# Patient Record
Sex: Female | Born: 1990 | Race: White | Hispanic: No | Marital: Single | Smoking: Never smoker
Health system: Southern US, Community
[De-identification: ages and names within clinical notes are randomized; demographics above are authoritative.]

## PROBLEM LIST (undated history)

## (undated) DIAGNOSIS — F988 Other specified behavioral and emotional disorders with onset usually occurring in childhood and adolescence: Secondary | ICD-10-CM

## (undated) HISTORY — PX: ANKLE SURGERY: SHX546

---
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
## 2014-06-24 ENCOUNTER — Emergency Department (HOSPITAL_COMMUNITY): Payer: No Typology Code available for payment source

## 2014-06-24 ENCOUNTER — Encounter (HOSPITAL_COMMUNITY): Payer: Self-pay | Admitting: Emergency Medicine

## 2014-06-24 ENCOUNTER — Emergency Department (HOSPITAL_COMMUNITY)
Admission: EM | Admit: 2014-06-24 | Discharge: 2014-06-25 | Disposition: A | Discharge status: Home / Self Care | Payer: No Typology Code available for payment source | Attending: Emergency Medicine | Admitting: Emergency Medicine

## 2014-06-24 DIAGNOSIS — S99919A Unspecified injury of unspecified ankle, initial encounter: Secondary | ICD-10-CM | POA: Insufficient documentation

## 2014-06-24 DIAGNOSIS — S8010XA Contusion of unspecified lower leg, initial encounter: Secondary | ICD-10-CM | POA: Diagnosis not present

## 2014-06-24 DIAGNOSIS — Y92410 Unspecified street and highway as the place of occurrence of the external cause: Secondary | ICD-10-CM | POA: Insufficient documentation

## 2014-06-24 DIAGNOSIS — T148XXA Other injury of unspecified body region, initial encounter: Secondary | ICD-10-CM

## 2014-06-24 DIAGNOSIS — Z3202 Encounter for pregnancy test, result negative: Secondary | ICD-10-CM | POA: Insufficient documentation

## 2014-06-24 DIAGNOSIS — S301XXA Contusion of abdominal wall, initial encounter: Secondary | ICD-10-CM | POA: Diagnosis not present

## 2014-06-24 DIAGNOSIS — S82843A Displaced bimalleolar fracture of unspecified lower leg, initial encounter for closed fracture: Secondary | ICD-10-CM | POA: Diagnosis not present

## 2014-06-24 DIAGNOSIS — S82841A Displaced bimalleolar fracture of right lower leg, initial encounter for closed fracture: Secondary | ICD-10-CM

## 2014-06-24 DIAGNOSIS — E669 Obesity, unspecified: Secondary | ICD-10-CM | POA: Insufficient documentation

## 2014-06-24 DIAGNOSIS — S20219A Contusion of unspecified front wall of thorax, initial encounter: Secondary | ICD-10-CM | POA: Diagnosis not present

## 2014-06-24 DIAGNOSIS — S8990XA Unspecified injury of unspecified lower leg, initial encounter: Secondary | ICD-10-CM | POA: Diagnosis present

## 2014-06-24 DIAGNOSIS — S99929A Unspecified injury of unspecified foot, initial encounter: Secondary | ICD-10-CM

## 2014-06-24 DIAGNOSIS — IMO0002 Reserved for concepts with insufficient information to code with codable children: Secondary | ICD-10-CM | POA: Insufficient documentation

## 2014-06-24 DIAGNOSIS — Y9389 Activity, other specified: Secondary | ICD-10-CM | POA: Diagnosis not present

## 2014-06-24 LAB — CBC WITH DIFFERENTIAL/PLATELET
BASOS ABS: 0 10*3/uL (ref 0.0–0.1)
Basophils Relative: 0 % (ref 0–1)
EOS PCT: 1 % (ref 0–5)
Eosinophils Absolute: 0.1 10*3/uL (ref 0.0–0.7)
HCT: 36.9 % (ref 36.0–46.0)
Hemoglobin: 12.4 g/dL (ref 12.0–15.0)
LYMPHS PCT: 13 % (ref 12–46)
Lymphs Abs: 1.9 10*3/uL (ref 0.7–4.0)
MCH: 28.4 pg (ref 26.0–34.0)
MCHC: 33.6 g/dL (ref 30.0–36.0)
MCV: 84.6 fL (ref 78.0–100.0)
Monocytes Absolute: 0.8 10*3/uL (ref 0.1–1.0)
Monocytes Relative: 5 % (ref 3–12)
NEUTROS ABS: 12.6 10*3/uL — AB (ref 1.7–7.7)
NEUTROS PCT: 81 % — AB (ref 43–77)
Platelets: 306 10*3/uL (ref 150–400)
RBC: 4.36 MIL/uL (ref 3.87–5.11)
RDW: 13.7 % (ref 11.5–15.5)
WBC: 15.5 10*3/uL — AB (ref 4.0–10.5)

## 2014-06-24 LAB — I-STAT CHEM 8, ED
BUN: 12 mg/dL (ref 6–23)
CHLORIDE: 103 meq/L (ref 96–112)
Calcium, Ion: 1.14 mmol/L (ref 1.12–1.23)
Creatinine, Ser: 0.7 mg/dL (ref 0.50–1.10)
Glucose, Bld: 135 mg/dL — ABNORMAL HIGH (ref 70–99)
HCT: 39 % (ref 36.0–46.0)
Hemoglobin: 13.3 g/dL (ref 12.0–15.0)
Potassium: 3.9 mEq/L (ref 3.7–5.3)
Sodium: 138 mEq/L (ref 137–147)
TCO2: 24 mmol/L (ref 0–100)

## 2014-06-24 LAB — I-STAT CG4 LACTIC ACID, ED: Lactic Acid, Venous: 1.85 mmol/L (ref 0.5–2.2)

## 2014-06-24 LAB — POC URINE PREG, ED: Preg Test, Ur: NEGATIVE

## 2014-06-24 LAB — SAMPLE TO BLOOD BANK

## 2014-06-24 MED ORDER — ETOMIDATE 2 MG/ML IV SOLN
10.0000 mg | Freq: Once | INTRAVENOUS | Status: AC
  Administered 2014-06-24: 10 mg via INTRAVENOUS
  Filled 2014-06-24: qty 10

## 2014-06-24 MED ORDER — ONDANSETRON HCL 4 MG/2ML IJ SOLN
4.0000 mg | Freq: Once | INTRAMUSCULAR | Status: AC
  Administered 2014-06-24: 4 mg via INTRAVENOUS
  Filled 2014-06-24: qty 2

## 2014-06-24 MED ORDER — FENTANYL CITRATE 0.05 MG/ML IJ SOLN: 100.0000 ug | Freq: Once | INTRAMUSCULAR | Status: DC

## 2014-06-24 MED ORDER — FENTANYL CITRATE 0.05 MG/ML IJ SOLN
50.0000 ug | Freq: Once | INTRAMUSCULAR | Status: AC
  Administered 2014-06-24: 50 ug via INTRAVENOUS
  Filled 2014-06-24: qty 2

## 2014-06-24 MED ORDER — HYDROMORPHONE HCL PF 1 MG/ML IJ SOLN
1.0000 mg | Freq: Once | INTRAMUSCULAR | Status: AC
  Administered 2014-06-24: 1 mg via INTRAVENOUS
  Filled 2014-06-24: qty 1

## 2014-06-24 MED ORDER — IOHEXOL 300 MG/ML  SOLN
100.0000 mL | Freq: Once | INTRAMUSCULAR | Status: AC | PRN
  Administered 2014-06-24: 100 mL via INTRAVENOUS

## 2014-06-24 MED ORDER — SODIUM CHLORIDE 0.9 % IV SOLN
Freq: Once | INTRAVENOUS | Status: AC
  Administered 2014-06-24: 18:00:00 via INTRAVENOUS

## 2014-06-24 MED ORDER — SODIUM CHLORIDE 0.9 % IV BOLUS (SEPSIS)
1000.0000 mL | Freq: Once | INTRAVENOUS | Status: AC
  Administered 2014-06-24: 1000 mL via INTRAVENOUS

## 2014-06-24 NOTE — ED Notes (Signed)
Pt to xray at this time.

## 2014-06-24 NOTE — ED Notes (Signed)
+   pedal pulse present in right foot.  Ice pack applied for comfort.  Pt logged rolled and spine board removed.  Pt denies any pain other than right ankle

## 2014-06-24 NOTE — ED Notes (Signed)
Family at bedside. 

## 2014-06-24 NOTE — ED Notes (Signed)
Dr. Rubin Payor given Lactic results: 1.85
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
## 2014-06-24 NOTE — ED Notes (Signed)
Pt resting at this time. Family at bedside.  Pt alert and oriented x's 3.  Skin warm and dry, color appropriate
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
## 2014-06-24 NOTE — ED Notes (Signed)
PT to CT at this time.

## 2014-06-24 NOTE — Progress Notes (Signed)
Orthopedic Tech Progress Note Patient Details:  Carle Dargan 03/29/1991 [id number]  Ortho Devices Type of Ortho Device: Ace wrap;Stirrup splint;Post (short leg) splint Ortho Device/Splint Location: rle Ortho Device/Splint Interventions: Application Ankle reduction; ordered by Dr. Jarvis Newcomer, Deeksha Cotrell 06/24/2014, 9:10 PM

## 2014-06-24 NOTE — ED Notes (Signed)
Family at bedside, pt remains alert and oriented, waiting for CT at this time.  Splint in place on right lower leg,

## 2014-06-25 DIAGNOSIS — S82843A Displaced bimalleolar fracture of unspecified lower leg, initial encounter for closed fracture: Secondary | ICD-10-CM | POA: Diagnosis not present

## 2014-06-25 MED ORDER — ONDANSETRON 4 MG PO TBDP
8.0000 mg | ORAL_TABLET | Freq: Once | ORAL | Status: AC
  Administered 2014-06-25: 8 mg via ORAL
  Filled 2014-06-25: qty 2

## 2014-06-25 MED ORDER — ONDANSETRON HCL 4 MG PO TABS: 4.0000 mg | ORAL_TABLET | Freq: Four times a day (QID) | ORAL | Status: DC

## 2014-06-25 MED ORDER — OXYCODONE-ACETAMINOPHEN 5-325 MG PO TABS
1.0000 | ORAL_TABLET | ORAL | Status: DC | PRN
Start: 2014-06-25 — End: 2014-06-25

## 2014-06-25 MED ORDER — OXYCODONE-ACETAMINOPHEN 5-325 MG PO TABS
1.0000 | ORAL_TABLET | Freq: Four times a day (QID) | ORAL | Status: DC | PRN
Start: 2014-06-25 — End: 2016-12-29

## 2014-06-25 MED ORDER — OXYCODONE-ACETAMINOPHEN 5-325 MG PO TABS
2.0000 | ORAL_TABLET | Freq: Once | ORAL | Status: AC
  Administered 2014-06-25: 2 via ORAL
  Filled 2014-06-25: qty 2
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
## 2016-05-18 DIAGNOSIS — F900 Attention-deficit hyperactivity disorder, predominantly inattentive type: Secondary | ICD-10-CM | POA: Diagnosis not present

## 2016-07-27 DIAGNOSIS — S93431D Sprain of tibiofibular ligament of right ankle, subsequent encounter: Secondary | ICD-10-CM | POA: Diagnosis not present

## 2016-08-07 DIAGNOSIS — M25571 Pain in right ankle and joints of right foot: Secondary | ICD-10-CM | POA: Diagnosis not present

## 2016-08-11 DIAGNOSIS — M25571 Pain in right ankle and joints of right foot: Secondary | ICD-10-CM | POA: Diagnosis not present

## 2016-08-18 DIAGNOSIS — M25571 Pain in right ankle and joints of right foot: Secondary | ICD-10-CM | POA: Diagnosis not present

## 2016-08-24 DIAGNOSIS — F900 Attention-deficit hyperactivity disorder, predominantly inattentive type: Secondary | ICD-10-CM | POA: Diagnosis not present

## 2016-12-29 ENCOUNTER — Encounter (HOSPITAL_COMMUNITY): Payer: Self-pay

## 2016-12-29 ENCOUNTER — Emergency Department (HOSPITAL_COMMUNITY): Payer: BLUE CROSS/BLUE SHIELD

## 2016-12-29 ENCOUNTER — Emergency Department (HOSPITAL_COMMUNITY)
Admission: EM | Admit: 2016-12-29 | Discharge: 2016-12-29 | Disposition: A | Discharge status: Home / Self Care | Payer: BLUE CROSS/BLUE SHIELD | Attending: Emergency Medicine | Admitting: Emergency Medicine

## 2016-12-29 DIAGNOSIS — R1012 Left upper quadrant pain: Secondary | ICD-10-CM

## 2016-12-29 DIAGNOSIS — R112 Nausea with vomiting, unspecified: Secondary | ICD-10-CM | POA: Insufficient documentation

## 2016-12-29 DIAGNOSIS — R1110 Vomiting, unspecified: Secondary | ICD-10-CM | POA: Diagnosis not present

## 2016-12-29 DIAGNOSIS — R509 Fever, unspecified: Secondary | ICD-10-CM | POA: Diagnosis not present

## 2016-12-29 DIAGNOSIS — F909 Attention-deficit hyperactivity disorder, unspecified type: Secondary | ICD-10-CM | POA: Insufficient documentation

## 2016-12-29 DIAGNOSIS — K529 Noninfective gastroenteritis and colitis, unspecified: Secondary | ICD-10-CM | POA: Diagnosis not present

## 2016-12-29 DIAGNOSIS — R109 Unspecified abdominal pain: Secondary | ICD-10-CM | POA: Diagnosis not present

## 2016-12-29 HISTORY — DX: Other specified behavioral and emotional disorders with onset usually occurring in childhood and adolescence: F98.8

## 2016-12-29 LAB — CBC
HCT: 40.8 % (ref 36.0–46.0)
Hemoglobin: 13.4 g/dL (ref 12.0–15.0)
MCH: 28.5 pg (ref 26.0–34.0)
MCHC: 32.8 g/dL (ref 30.0–36.0)
MCV: 86.8 fL (ref 78.0–100.0)
PLATELETS: 268 10*3/uL (ref 150–400)
RBC: 4.7 MIL/uL (ref 3.87–5.11)
RDW: 13.7 % (ref 11.5–15.5)
WBC: 7.4 10*3/uL (ref 4.0–10.5)

## 2016-12-29 LAB — URINALYSIS, ROUTINE W REFLEX MICROSCOPIC
Bacteria, UA: NONE SEEN
Bilirubin Urine: NEGATIVE
GLUCOSE, UA: NEGATIVE mg/dL
Hgb urine dipstick: NEGATIVE
Ketones, ur: NEGATIVE mg/dL
Leukocytes, UA: NEGATIVE
NITRITE: NEGATIVE
PH: 5 (ref 5.0–8.0)
Protein, ur: 30 mg/dL — AB
Specific Gravity, Urine: 1.032 — ABNORMAL HIGH (ref 1.005–1.030)

## 2016-12-29 LAB — COMPREHENSIVE METABOLIC PANEL
ALT: 23 U/L (ref 14–54)
ANION GAP: 11 (ref 5–15)
AST: 25 U/L (ref 15–41)
Albumin: 4.1 g/dL (ref 3.5–5.0)
Alkaline Phosphatase: 54 U/L (ref 38–126)
BILIRUBIN TOTAL: 0.8 mg/dL (ref 0.3–1.2)
BUN: 13 mg/dL (ref 6–20)
CO2: 22 mmol/L (ref 22–32)
Calcium: 9.2 mg/dL (ref 8.9–10.3)
Chloride: 105 mmol/L (ref 101–111)
Creatinine, Ser: 0.82 mg/dL (ref 0.44–1.00)
GFR calc Af Amer: >60 mL/min (ref >60)
GFR calc non Af Amer: >60 mL/min (ref >60)
Glucose, Bld: 126 mg/dL — ABNORMAL HIGH (ref 65–99)
Potassium: 3.6 mmol/L (ref 3.5–5.1)
Sodium: 138 mmol/L (ref 135–145)
TOTAL PROTEIN: 7.9 g/dL (ref 6.5–8.1)

## 2016-12-29 LAB — I-STAT BETA HCG BLOOD, ED (MC, WL, AP ONLY): I-stat hCG, quantitative: <5 m[IU]/mL (ref <5)

## 2016-12-29 LAB — LIPASE, BLOOD: Lipase: 12 U/L (ref 11–51)

## 2016-12-29 MED ORDER — ACETAMINOPHEN 325 MG PO TABS
650.0000 mg | ORAL_TABLET | Freq: Once | ORAL | Status: AC
  Administered 2016-12-29: 650 mg via ORAL
  Filled 2016-12-29: qty 2

## 2016-12-29 MED ORDER — IOPAMIDOL (ISOVUE-300) INJECTION 61%
INTRAVENOUS | Status: AC
  Administered 2016-12-29: 100 mL
  Filled 2016-12-29: qty 100

## 2016-12-29 MED ORDER — KETOROLAC TROMETHAMINE 30 MG/ML IJ SOLN: 30.0000 mg | Freq: Once | INTRAMUSCULAR | Status: DC

## 2016-12-29 MED ORDER — ONDANSETRON 4 MG PO TBDP: 4.0000 mg | ORAL_TABLET | Freq: Three times a day (TID) | ORAL | 0 refills | Status: DC | PRN

## 2016-12-29 MED ORDER — SODIUM CHLORIDE 0.9 % IV BOLUS (SEPSIS)
1000.0000 mL | Freq: Once | INTRAVENOUS | Status: AC
  Administered 2016-12-29: 1000 mL via INTRAVENOUS

## 2016-12-29 NOTE — Discharge Instructions (Signed)
Your CT scan was unremarkable. You likely have a viral infection. I gave you a prescription for medicine to take for your nausea. Drink plenty of fluids to stay hydrated. Take Tylenol or ibuprofen as needed for pain/fever. Follow up with your primary care provider this week. Return to the ER for new or worsening symptoms.

## 2016-12-29 NOTE — ED Notes (Signed)
Patient states that she had a fever at home of 100.3.

## 2016-12-29 NOTE — ED Triage Notes (Signed)
Pt sent here [facility] for onset last night 9pm abdominal pain, fever, N&V x 10.  Pt was given Phenergan IM at u/c.

## 2016-12-29 NOTE — ED Triage Notes (Signed)
Pt was also given Tylenol at u/c around 9am.

## 2016-12-29 NOTE — ED Notes (Signed)
Rounded on patient aware she is waiting on a stretcher no further complaints.

## 2016-12-29 NOTE — ED Provider Notes (Signed)
[facility]-EMERGENCY DEPT Provider Note   CSN: [id number] Arrival date & time: 12/29/16  1125  History   Chief Complaint Chief Complaint  Patient presents with  . Abdominal Pain   HPI  Sydney Coffey is an 26 y.o. female with history of ADHD who presents to the ED for evaluation of abdominal pain. She states since 9PM last night she had "numerous" episodes of NBNB emesis and nausea. She describes diffuse crampy abdominal pain which has since improved. She still has intermittent twinges of LUQ pain. Currently she rates her pain 4-5/10. Her nausea has resolved since receiving IM phenergan at urgent care earlier today. She does note associated oral temp 100.58F at home this morning; she was given a dose of Tylenol at UC around 9 AM and fever has since resolved. She states overall she feels much better. Denies any diarrhea. Denies cough, congestion, body aches. Denies chest pain or SOB. She was sent to the ED from urgent care for further evaluation.   Past Medical History:  Diagnosis Date  . ADD (attention deficit disorder)     There are no active problems to display for this patient.   Past Surgical History:  Procedure Laterality Date  . ANKLE SURGERY Right     OB History    No data available       Home Medications    Prior to Admission medications   Medication Sig Start Date End Date Taking? Authorizing Provider  CALCIUM PO Take 1 tablet by mouth daily.    Historical Provider, MD  CINNAMON PO Take 1 tablet by mouth daily.    Historical Provider, MD  Multiple Vitamins-Minerals (HAIR/SKIN/NAILS) TABS Take 1 tablet by mouth daily.    Historical Provider, MD  ondansetron (ZOFRAN) 4 MG tablet Take 1 tablet (4 mg total) by mouth every 6 (six) hours. 06/25/14   Junious Silk, PA-C  oxyCODONE-acetaminophen (PERCOCET/ROXICET) 5-325 MG per tablet Take 1 tablet by mouth every 6 (six) hours as needed for severe pain. May take 2 tablets PO q 6 hours for severe pain - Do not take with Tylenol as this  tablet already contains tylenol 06/25/14   Junious Silk, PA-C    Family History History reviewed. No pertinent family history.  Social History Social History  Substance Use Topics  . Smoking status: Never Smoker  . Smokeless tobacco: Never Used  . Alcohol use Yes     Comment: rarely     Allergies   Patient has no known allergies.   Review of Systems Review of Systems 10 Systems reviewed and are negative for acute change except as noted in the HPI.   Physical Exam Updated Vital Signs BP 118/74   Pulse (!) 104   Temp 98.6 F (37 C) (Oral)   Resp 16   Ht 5\' 4"  (1.626 m)   Wt 124.7 kg Comment: pt was weighed at urgent care this morning  LMP 12/07/2016   SpO2 97%   BMI 47.20 kg/m   Physical Exam  Constitutional: She is oriented to person, place, and time.  HENT:  Right Ear: External ear normal.  Left Ear: External ear normal.  Nose: Nose normal.  Mouth/Throat: Oropharynx is clear and moist. No oropharyngeal exudate.  Eyes: Conjunctivae are normal.  Neck: Neck supple.  Cardiovascular: Normal rate, regular rhythm, normal heart sounds and intact distal pulses.   Pulmonary/Chest: Effort normal and breath sounds normal. No respiratory distress. She has no wheezes.  Abdominal: Soft. Bowel sounds are normal. She exhibits no distension. There is  tenderness (minimal diffuse tenderness but most in LUQ). There is no rebound and no guarding.  Musculoskeletal: She exhibits no edema.  Lymphadenopathy:    She has no cervical adenopathy.  Neurological: She is alert and oriented to person, place, and time. No cranial nerve deficit.  Skin: Skin is warm and dry.  Psychiatric: She has a normal mood and affect.  Nursing note and vitals reviewed.    ED Treatments / Results  Labs (all labs ordered are listed, but only abnormal results are displayed) Labs Reviewed  COMPREHENSIVE METABOLIC PANEL - Abnormal; Notable for the following:       Result Value   Glucose, Bld 126 (*)     All other components within normal limits  URINALYSIS, ROUTINE W REFLEX MICROSCOPIC - Abnormal; Notable for the following:    APPearance HAZY (*)    Specific Gravity, Urine 1.032 (*)    Protein, ur 30 (*)    Squamous Epithelial / LPF 0-5 (*)    All other components within normal limits  LIPASE, BLOOD  CBC  I-STAT BETA HCG BLOOD, ED ([facility], [facility], [facility] ONLY)    EKG  EKG Interpretation None       Radiology Ct Abdomen Pelvis W Contrast  Result Date: 12/29/2016 CLINICAL DATA:  Lower abdominal pain, fever and vomiting. EXAM: CT ABDOMEN AND PELVIS WITH CONTRAST TECHNIQUE: Multidetector CT imaging of the abdomen and pelvis was performed using the standard protocol following bolus administration of intravenous contrast. CONTRAST:  100 ISOVUE-300 IOPAMIDOL (ISOVUE-300) INJECTION 61% COMPARISON:  06/24/2014 FINDINGS: Lower chest: No acute abnormality. Hepatobiliary: No focal liver abnormality is seen. No gallstones, gallbladder wall thickening, or biliary dilatation. Pancreas: Unremarkable. No pancreatic ductal dilatation or surrounding inflammatory changes. Spleen: Normal in size without focal abnormality. Accessory splenule noted. Adrenals/Urinary Tract: Adrenal glands are unremarkable. Kidneys are normal, without renal calculi, focal lesion, or hydronephrosis. Bladder is unremarkable. Stomach/Bowel: Stomach is within normal limits. Appendix appears normal. No evidence of bowel wall thickening, distention, or inflammatory changes. Vascular/Lymphatic: No significant vascular findings are present. No enlarged abdominal or pelvic lymph nodes. Reproductive: Uterus and bilateral adnexa are unremarkable. Other: No abdominal wall hernia or abnormality. No abdominopelvic ascites. Musculoskeletal: No acute osseous finding. L5-S1 degenerative disc disease noted. IMPRESSION: No acute intra-abdominal or pelvic finding.  Stable exam. Electronically Signed   By: Judie Petit.  Shick M.D.   On: 12/29/2016 19:15     Procedures Procedures (including critical care time)  Medications Ordered in ED Medications  sodium chloride 0.9 % bolus 1,000 mL (0 mLs Intravenous Stopped 12/29/16 1941)  acetaminophen (TYLENOL) tablet 650 mg (650 mg Oral Given 12/29/16 1940)  iopamidol (ISOVUE-300) 61 % injection (100 mLs  Contrast Given 12/29/16 1855)     Initial Impression / Assessment and Plan / ED Course  I have reviewed the triage vital signs and the nursing notes.  Pertinent labs & imaging results that were available during my care of the patient were reviewed by me and considered in my medical decision making (see chart for details).    26 y.o. female sent from UC for further eval of n/v and abdominal pain. Low grade fever at home. Afebrile here. Labs unrevealing. Abdominal exam with mild diffuse tenderness but no localized tenderness, no evidence of peritonitis. She does appear a bit dry and urine with increased specific gravity. This is likely contributing to her tachycardia. We'll hydrate with a liter of NS. She declines anti-emetics, states nausea has resolved. We did obtain a CT abd/pelvis given urgent care's concern  for acute appy and pt's temp starting to trend up again as tylenol wearing off. CT negative for acute findings. Pt is hungry and is tolerating PO. Suspect viral gastroenteritis. Will dc home with rx for zofran, encouraged supportive therapies. Strict return precautions given.  Final Clinical Impressions(s) / ED Diagnoses   Final diagnoses:  Left upper quadrant pain  Non-intractable vomiting with nausea, unspecified vomiting type    New Prescriptions Discharge Medication List as of 12/29/2016  7:21 PM    START taking these medications   Details  ondansetron (ZOFRAN ODT) 4 MG disintegrating tablet Take 1 tablet (4 mg total) by mouth every 8 (eight) hours as needed for nausea or vomiting., Starting Tue 12/29/2016, Print         Carlene CoriaSerena Y Rajon Bisig, PA-C 12/29/16 1951    Canary Brimhristopher J  Tegeler, MD 12/30/16 1140

## 2017-02-22 DIAGNOSIS — F900 Attention-deficit hyperactivity disorder, predominantly inattentive type: Secondary | ICD-10-CM | POA: Diagnosis not present

## 2017-02-22 DIAGNOSIS — R7303 Prediabetes: Secondary | ICD-10-CM | POA: Diagnosis not present

## 2017-08-25 DIAGNOSIS — R7303 Prediabetes: Secondary | ICD-10-CM | POA: Diagnosis not present

## 2017-08-25 DIAGNOSIS — F900 Attention-deficit hyperactivity disorder, predominantly inattentive type: Secondary | ICD-10-CM | POA: Diagnosis not present

## 2018-02-22 DIAGNOSIS — F900 Attention-deficit hyperactivity disorder, predominantly inattentive type: Secondary | ICD-10-CM | POA: Diagnosis not present

## 2018-04-07 DIAGNOSIS — M545 Low back pain: Secondary | ICD-10-CM | POA: Diagnosis not present

## 2018-04-07 DIAGNOSIS — S339XXA Sprain of unspecified parts of lumbar spine and pelvis, initial encounter: Secondary | ICD-10-CM | POA: Diagnosis not present

## 2018-08-26 DIAGNOSIS — R7303 Prediabetes: Secondary | ICD-10-CM | POA: Diagnosis not present

## 2018-08-26 DIAGNOSIS — F900 Attention-deficit hyperactivity disorder, predominantly inattentive type: Secondary | ICD-10-CM | POA: Diagnosis not present

## 2018-11-17 IMAGING — CT CT ABD-PELV W/ CM
2 of 4 series · 17 of 46 positions shown, 19 images · IV contrast (APPLIED)
Comparison: 06/24/2014

CLINICAL DATA: Lower abdominal pain, fever and vomiting.

EXAM:
CT ABDOMEN AND PELVIS WITH CONTRAST
TECHNIQUE: Multidetector CT imaging of the abdomen and pelvis was performed
using the standard protocol following bolus administration of
intravenous contrast.
CONTRAST:  100 7EW9Z9-4YY IOPAMIDOL (7EW9Z9-4YY) INJECTION 61%

[Series 3: abd/ pelvis 5.0 i30f 2 · axial · 0.98mm/px · z∈[+675,+1130]mm · 14 of 101 slices shown, 16 images]
[im 5/101  soft-tissue]
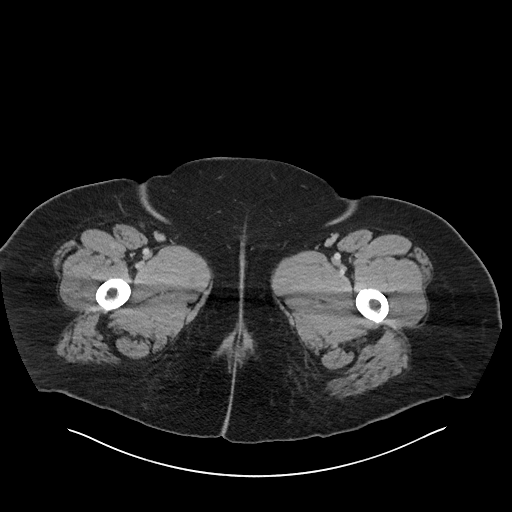
[im 5/101  bone]
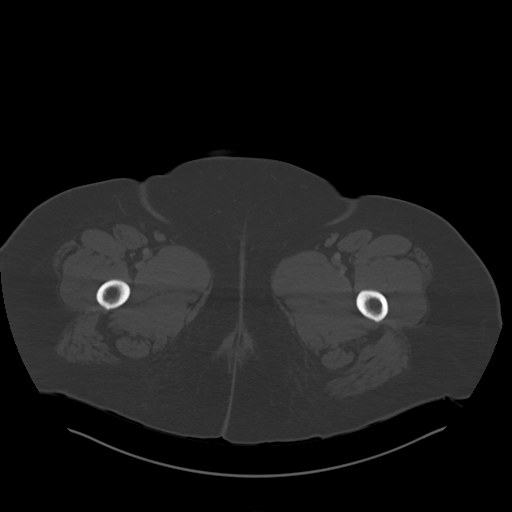
[im 13/101  soft-tissue]
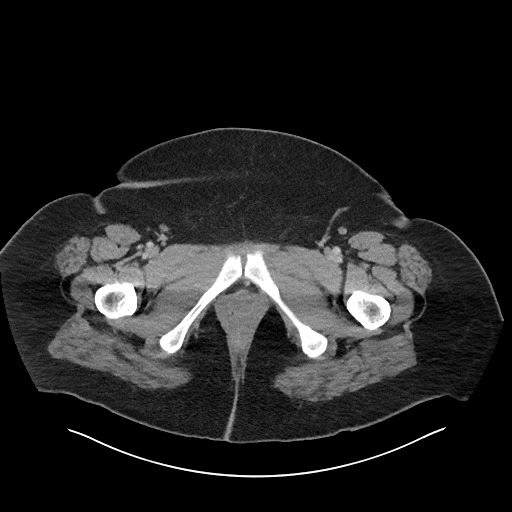
[im 21/101  soft-tissue]
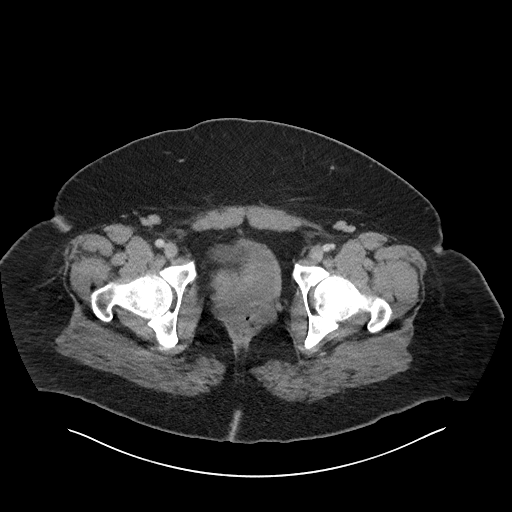
[im 26/101  soft-tissue]
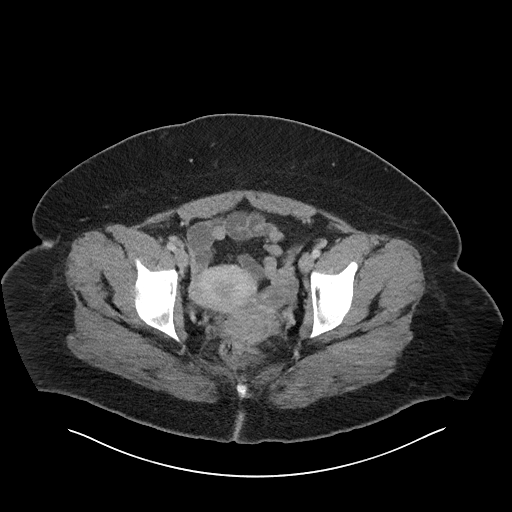
[im 34/101  soft-tissue]
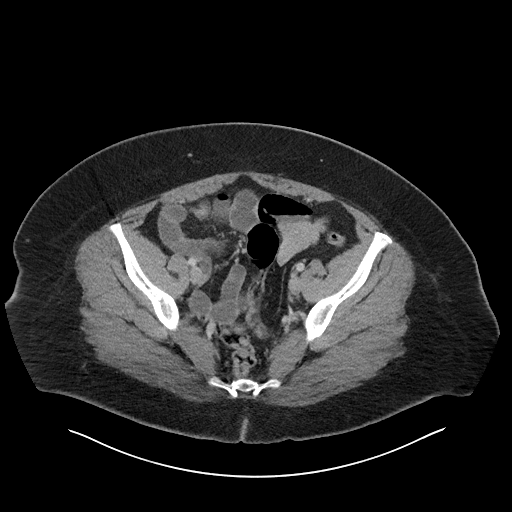
[im 42/101  soft-tissue]
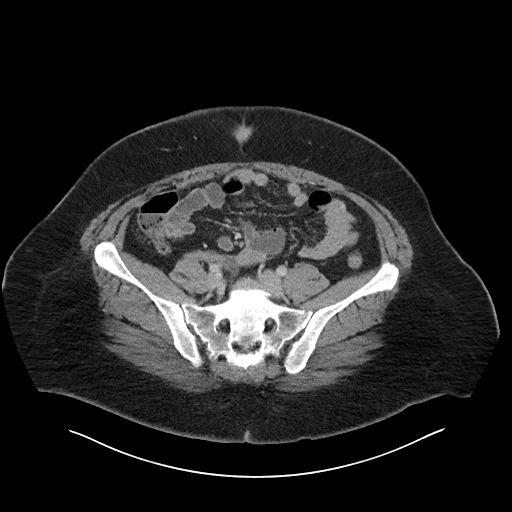
[im 46/101  soft-tissue]
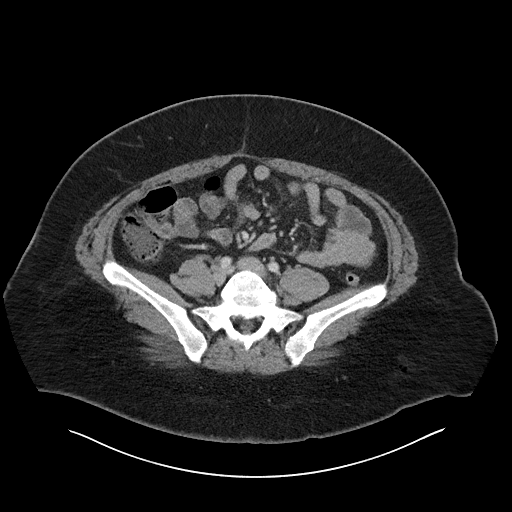
[im 55/101  soft-tissue]
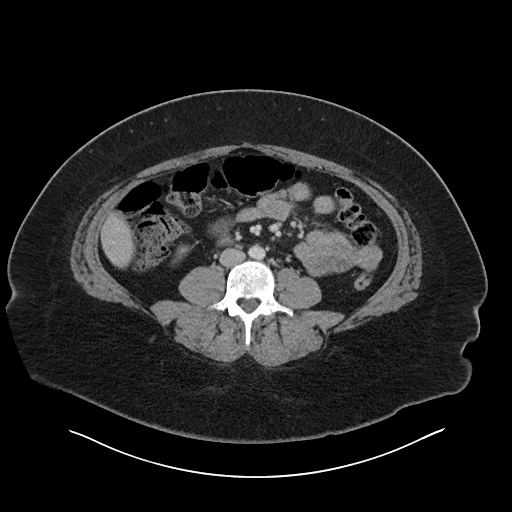
[im 59/101  soft-tissue]
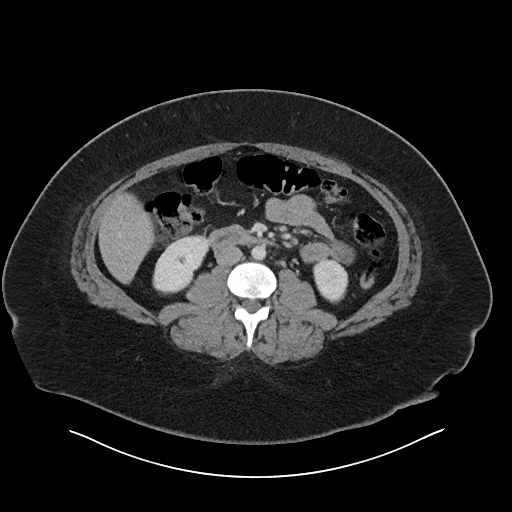
[im 59/101  bone]
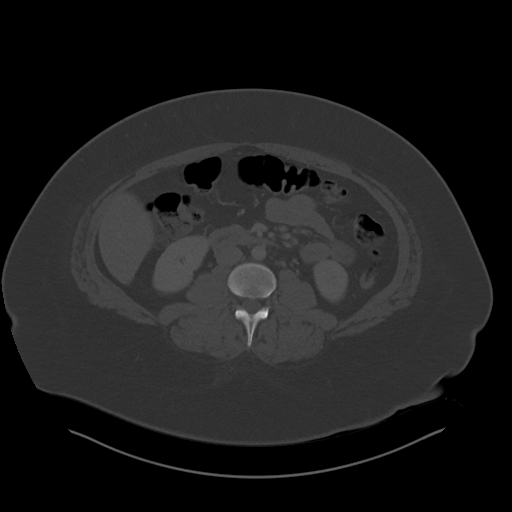
[im 67/101  soft-tissue]
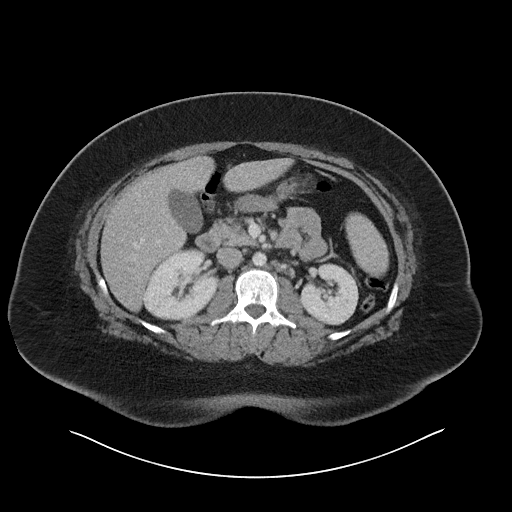
[im 76/101  soft-tissue]
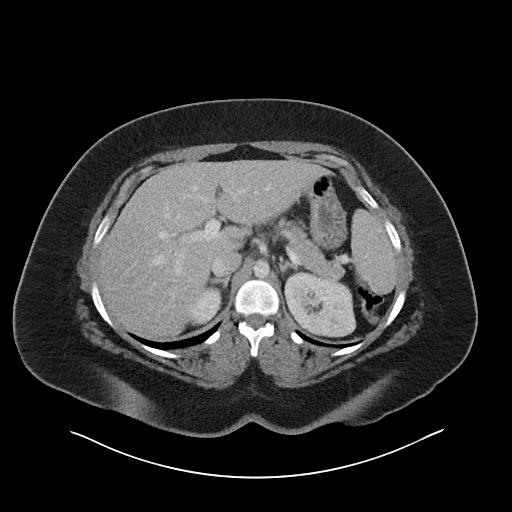
[im 80/101  soft-tissue]
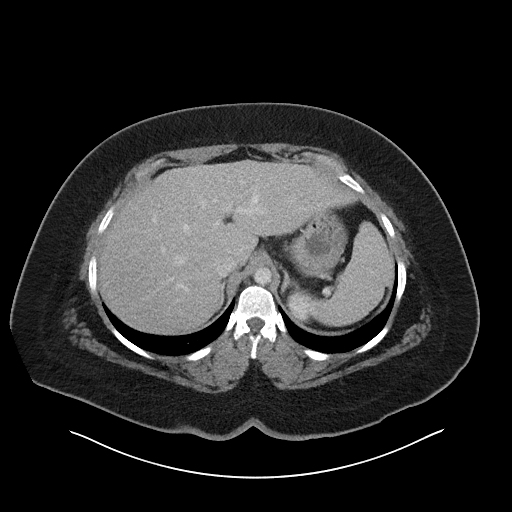
[im 88/101  soft-tissue]
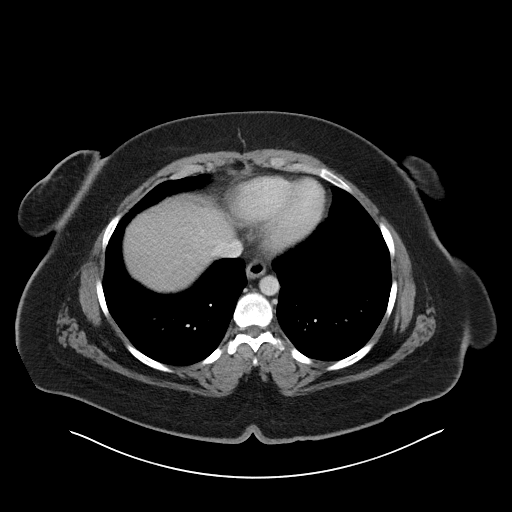
[im 96/101  soft-tissue]
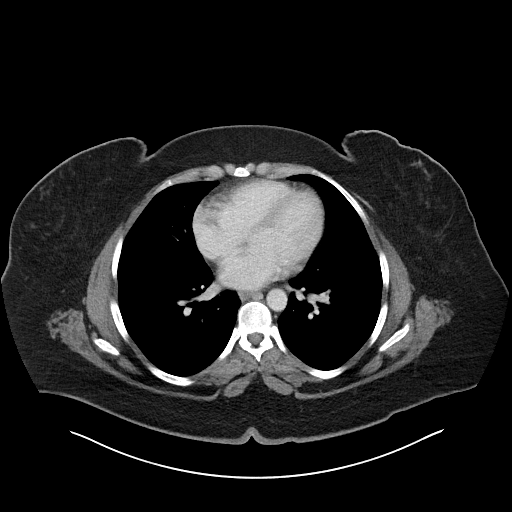

[Series 5: coronal soft tissue · coronal · 0.96mm/px · 3 of 120 slices shown]
[im 40/120  soft-tissue]
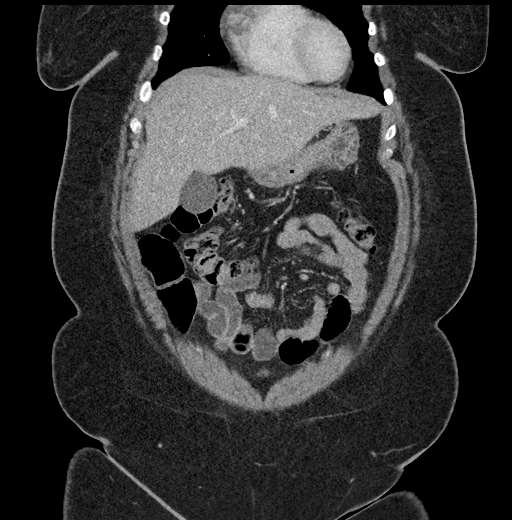
[im 53/120  soft-tissue]
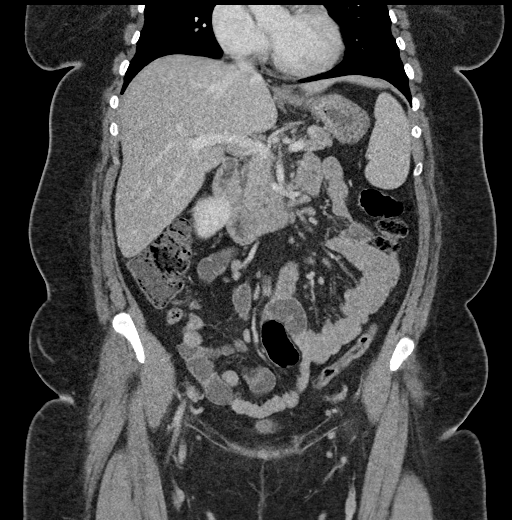
[im 67/120  soft-tissue]
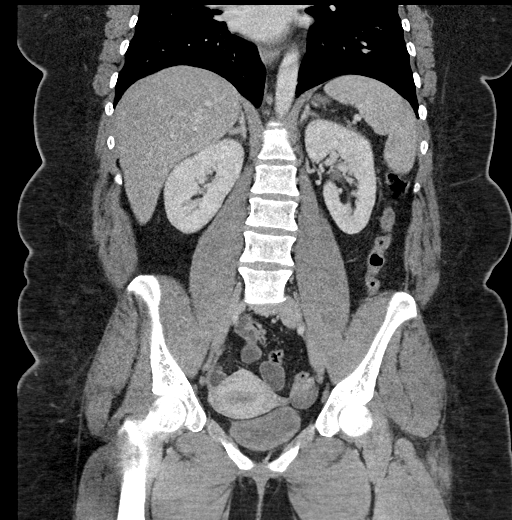

[17 of 46 positions shown; findings below may reference images not displayed]

FINDINGS: Lower chest: No acute abnormality.

Hepatobiliary: No focal liver abnormality is seen. No gallstones,
gallbladder wall thickening, or biliary dilatation.

Pancreas: Unremarkable. No pancreatic ductal dilatation or
surrounding inflammatory changes.

Spleen: Normal in size without focal abnormality. Accessory splenule
noted.

Adrenals/Urinary Tract: Adrenal glands are unremarkable. Kidneys are
normal, without renal calculi, focal lesion, or hydronephrosis.
Bladder is unremarkable.

Stomach/Bowel: Stomach is within normal limits. Appendix appears
normal. No evidence of bowel wall thickening, distention, or
inflammatory changes.

Vascular/Lymphatic: No significant vascular findings are present. No
enlarged abdominal or pelvic lymph nodes.

Reproductive: Uterus and bilateral adnexa are unremarkable.

Other: No abdominal wall hernia or abnormality. No abdominopelvic
ascites.

Musculoskeletal: No acute osseous finding. L5-S1 degenerative disc
disease noted.
IMPRESSION: No acute intra-abdominal or pelvic finding.  Stable exam.

## 2019-02-24 DIAGNOSIS — F900 Attention-deficit hyperactivity disorder, predominantly inattentive type: Secondary | ICD-10-CM | POA: Diagnosis not present

## 2019-09-15 DIAGNOSIS — F900 Attention-deficit hyperactivity disorder, predominantly inattentive type: Secondary | ICD-10-CM | POA: Diagnosis not present

## 2020-04-28 DIAGNOSIS — Z30011 Encounter for initial prescription of contraceptive pills: Secondary | ICD-10-CM | POA: Diagnosis not present

## 2023-03-18 ENCOUNTER — Ambulatory Visit
Admission: EM | Admit: 2023-03-18 | Discharge: 2023-03-18 | Disposition: A | Discharge status: Home / Self Care | Payer: Commercial Managed Care - HMO | Attending: Internal Medicine | Admitting: Internal Medicine

## 2023-03-18 DIAGNOSIS — Z1152 Encounter for screening for COVID-19: Secondary | ICD-10-CM | POA: Insufficient documentation

## 2023-03-18 DIAGNOSIS — B349 Viral infection, unspecified: Secondary | ICD-10-CM | POA: Diagnosis not present

## 2023-03-18 LAB — POCT INFLUENZA A/B
Influenza A, POC: NEGATIVE
Influenza B, POC: NEGATIVE

## 2023-03-18 MED ORDER — IBUPROFEN 800 MG PO TABS
800.0000 mg | ORAL_TABLET | Freq: Once | ORAL | Status: AC
  Administered 2023-03-18: 800 mg via ORAL

## 2023-03-18 NOTE — Discharge Instructions (Signed)
Rapid flu test was negative.  COVID test is pending.  Will call if it is abnormal.  Suspect viral illness as we discussed that should run its course.  Ensure adequate fluid hydration and rest.  Follow-up if any symptoms persist or worsen.

## 2023-03-18 NOTE — ED Triage Notes (Signed)
Pt reports she had chills, sweats, severe headache, dizziness, fever since earlier today. Took tylenol for relief.

## 2023-03-18 NOTE — ED Provider Notes (Signed)
[facility]    CSN: [id number] Arrival date & time: 03/18/23  9147      History   Chief Complaint Chief Complaint  Patient presents with   Fever    Fever chills. Headache. temp was 102.3 - Entered by patient    HPI Harmanie Trivitt is a 32 y.o. female.   Patient presents with chills, sweats, headache, fever, body aches that started this morning around 4 AM.  Denies nausea, vomiting, diarrhea, cough, nasal congestion, runny nose, sore throat, chest pain, shortness of breath.  Tmax at home was 102.  Patient denies any dysuria and is urinating appropriately.  Has taken Tylenol for symptoms.  Denies any known sick contacts.   Fever   Past Medical History:  Diagnosis Date   ADD (attention deficit disorder)     There are no problems to display for this patient.   Past Surgical History:  Procedure Laterality Date   ANKLE SURGERY Right     OB History   No obstetric history on file.      Home Medications    Prior to Admission medications   Medication Sig Start Date End Date Taking? Authorizing Provider  levothyroxine (SYNTHROID) 75 MCG tablet Take 75 mcg by mouth daily. 01/27/23  Yes [provider]  metFORMIN (GLUCOPHAGE-XR) 500 MG 24 hr tablet Take 500 mg by mouth 2 (two) times daily. 01/27/23  Yes [provider]  amphetamine-dextroamphetamine (ADDERALL) 20 MG tablet Take 20 mg by mouth daily. 12/28/16   [provider]  ondansetron (ZOFRAN ODT) 4 MG disintegrating tablet Take 1 tablet (4 mg total) by mouth every 8 (eight) hours as needed for nausea or vomiting. 12/29/16   Eliseo Squires, PA-C    Family History History reviewed. No pertinent family history.  Social History Social History   Tobacco Use   Smoking status: Never   Smokeless tobacco: Never  Substance Use Topics   Alcohol use: Yes    Comment: rarely   Drug use: No     Allergies   Patient has no known allergies.   Review of Systems Review of Systems Per  HPI  Physical Exam Triage Vital Signs ED Triage Vitals [03/18/23 1849]  Enc Vitals Group     BP (!) 151/87     Pulse Rate (!) 119     Resp      Temp 99.9 F (37.7 C)     Temp Source Oral     SpO2 97 %     Weight      Height      Head Circumference      Peak Flow      Pain Score 5     Pain Loc      Pain Edu?      Excl. in GC?    No data found.  Updated Vital Signs BP (!) 151/87 (BP Location: Left Arm)   Pulse (!) 104   Temp 99.9 F (37.7 C) (Oral)   LMP 03/10/2023 (Approximate)   SpO2 97%   Visual Acuity Right Eye Distance:   Left Eye Distance:   Bilateral Distance:    Right Eye Near:   Left Eye Near:    Bilateral Near:     Physical Exam Constitutional:      General: She is not in acute distress.    Appearance: Normal appearance. She is not toxic-appearing or diaphoretic.  HENT:     Head: Normocephalic and atraumatic.     Right Ear: Tympanic membrane and  ear canal normal.     Left Ear: Tympanic membrane and ear canal normal.     Nose: No congestion.     Mouth/Throat:     Mouth: Mucous membranes are moist.     Pharynx: No posterior oropharyngeal erythema.  Eyes:     Extraocular Movements: Extraocular movements intact.     Conjunctiva/sclera: Conjunctivae normal.     Pupils: Pupils are equal, round, and reactive to light.  Cardiovascular:     Rate and Rhythm: Regular rhythm. Tachycardia present.     Pulses: Normal pulses.     Heart sounds: Normal heart sounds.  Pulmonary:     Effort: Pulmonary effort is normal. No respiratory distress.     Breath sounds: Normal breath sounds. No stridor. No wheezing, rhonchi or rales.  Abdominal:     General: Abdomen is flat. Bowel sounds are normal.     Palpations: Abdomen is soft.  Musculoskeletal:        General: Normal range of motion.     Cervical back: Normal range of motion.  Skin:    General: Skin is warm and dry.  Neurological:     General: No focal deficit present.     Mental Status: She is alert and  oriented to person, place, and time. Mental status is at baseline.  Psychiatric:        Mood and Affect: Mood normal.        Behavior: Behavior normal.      UC Treatments / Results  Labs (all labs ordered are listed, but only abnormal results are displayed) Labs Reviewed  SARS CORONAVIRUS 2 (TAT 6-24 HRS)  POCT INFLUENZA A/B    EKG   Radiology No results found.  Procedures Procedures (including critical care time)  Medications Ordered in UC Medications  ibuprofen (ADVIL) tablet 800 mg (800 mg Oral Given 03/18/23 1856)    Initial Impression / Assessment and Plan / UC Course  I have reviewed the triage vital signs and the nursing notes.  Pertinent labs & imaging results that were available during my care of the patient were reviewed by me and considered in my medical decision making (see chart for details).     Suspect viral cause of symptoms.  Patient is mildly tachycardic.  ibuprofen administered with improvement in heart rate which is reassuring that patient is safe for discharge.  Suspect tachycardia is related to fever and inflammation.  Do not think emergent evaluation is necessary at this time.  Rapid flu is negative.  COVID test pending.  Strep testing deferred given patient is not having any sore throat.  Advised adequate fluid hydration, rest, supportive care.  Advised strict return precautions.  Patient verbalized understanding and was agreeable with plan. Final Clinical Impressions(s) / UC Diagnoses   Final diagnoses:  Viral illness     Discharge Instructions      Rapid flu test was negative.  COVID test is pending.  Will call if it is abnormal.  Suspect viral illness as we discussed that should run its course.  Ensure adequate fluid hydration and rest.  Follow-up if any symptoms persist or worsen.     ED Prescriptions   None    PDMP not reviewed this encounter.   Gustavus Bryant, [place] 03/18/23 1924

## 2023-03-19 ENCOUNTER — Ambulatory Visit
Admission: EM | Admit: 2023-03-19 | Discharge: 2023-03-19 | Disposition: A | Discharge status: Home / Self Care | Payer: Commercial Managed Care - HMO | Attending: Family | Admitting: Family

## 2023-03-19 DIAGNOSIS — R000 Tachycardia, unspecified: Secondary | ICD-10-CM

## 2023-03-19 DIAGNOSIS — L03115 Cellulitis of right lower limb: Secondary | ICD-10-CM

## 2023-03-19 DIAGNOSIS — M79661 Pain in right lower leg: Secondary | ICD-10-CM | POA: Diagnosis not present

## 2023-03-19 DIAGNOSIS — M7989 Other specified soft tissue disorders: Secondary | ICD-10-CM | POA: Diagnosis not present

## 2023-03-19 LAB — SARS CORONAVIRUS 2 (TAT 6-24 HRS): SARS Coronavirus 2: NEGATIVE

## 2023-03-19 MED ORDER — DOXYCYCLINE HYCLATE 100 MG PO CAPS: 100.0000 mg | ORAL_CAPSULE | Freq: Two times a day (BID) | ORAL | 0 refills | Status: AC

## 2023-03-19 NOTE — Discharge Instructions (Addendum)
Recommend start Doxycycline 100mg  twice a day with food for 10 days. May continue OTC Tylenol 1000mg  every 8 hours as needed. May alternate with Ibuprofen 600mg  every 8 hours as needed. May use OTC Hydrocortisone cream - apply 2 to 3 times a day to area for comfort. Continue to monitor area- if increase in redness, swelling, pain or fever does not starting resolving within 48 hours, return for recheck or go to ER.

## 2023-03-19 NOTE — ED Provider Notes (Signed)
[facility]    CSN: [id number] Arrival date & time: 03/19/23  4098      History   Chief Complaint Chief Complaint  Patient presents with   Rash    Come in yesterday for fever and chills and I didn't even think to mention about the rash I have on my leg. My leg is sore and warm to the touch. My fever has gone done from yesterday. At 101.2 now. Still have a headache but the pain has gone down. - Entered by patient    HPI Isidra Sarpong is a 32 y.o. female.   32 year old female presents for follow-up to visit from yesterday. She was seen here initially for fever up to 102, body aches, chills and headache. Denies any nasal congestion, cough or GI symptoms. Was tested for Influenza and COVID which came back negative. She forgot to mention yesterday that she started experiencing a slight redness/rash of her right lower leg near multiple insect bites about 1 week ago. 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 Now right lower leg is much more swollen, red, warm to touch and sore. Getting worse. Has not applied any cream to area. Has been taking OTC Tylenol with some relief. Last dose of Ibuprofen was given here yesterday. Other chronic health issues include thyroid disorder and impaired glucose. Currently on Synthroid and Glucophage daily.   The history is provided by the patient and a caregiver.    Past Medical History:  Diagnosis Date   ADD (attention deficit disorder)     There are no problems to display for this patient.   Past Surgical History:  Procedure Laterality Date   ANKLE SURGERY Right     OB History   No obstetric history on file.      Home Medications    Prior to Admission medications   Medication Sig Start Date End Date Taking? Authorizing Provider  doxycycline (VIBRAMYCIN) 100 MG capsule Take 1 capsule (100 mg total) by mouth 2 (two) times daily for 10 days. 03/19/23 03/29/23 Yes Rigel Filsinger, Ali Lowe, NP  levothyroxine (SYNTHROID)  75 MCG tablet Take 75 mcg by mouth daily. 01/27/23   [provider]  metFORMIN (GLUCOPHAGE-XR) 500 MG 24 hr tablet Take 500 mg by mouth 2 (two) times daily. 01/27/23   [provider]    Family History History reviewed. No pertinent family history.  Social History Social History   Tobacco Use   Smoking status: Never   Smokeless tobacco: Never  Substance Use Topics   Alcohol use: Yes    Comment: rarely   Drug use: No     Allergies   Patient has no known allergies.   Review of Systems Review of Systems  Constitutional:  Positive for chills (improved), fatigue and fever. Negative for activity change and appetite change.  HENT:  Negative for congestion, mouth sores, sore throat and trouble swallowing.   Respiratory:  Negative for cough, chest tightness, shortness of breath and wheezing.   Cardiovascular:  Negative for chest pain and palpitations.  Gastrointestinal:  Negative for nausea and vomiting.  Musculoskeletal:  Positive for myalgias (right lower leg). Negative for arthralgias, gait problem and joint swelling.  Skin:  Positive for color change, rash and wound (multiple insect bites on lower legs).  Allergic/Immunologic: Negative for environmental allergies, food allergies and immunocompromised state.  Neurological:  Positive for headaches (improved from yesterday). Negative for dizziness, tremors, seizures, syncope, light-headedness and numbness.  Hematological:  Negative  for adenopathy. Does not bruise/bleed easily.     Physical Exam Triage Vital Signs ED Triage Vitals  Enc Vitals Group     BP 03/19/23 0840 (!) 150/77     Pulse Rate 03/19/23 0837 (!) 111     Resp 03/19/23 0837 16     Temp 03/19/23 0837 99.7 F (37.6 C)     Temp Source 03/19/23 0837 Oral     SpO2 03/19/23 0837 97 %     Weight --      Height --      Head Circumference --      Peak Flow --      Pain Score 03/19/23 0839 4     Pain Loc --      Pain Edu? --      Excl. in GC? --     No data found.  Updated Vital Signs BP (!) 150/77 (BP Location: Left Arm)   Pulse (!) 111   Temp 99.7 F (37.6 C) (Oral)   Resp 16   LMP 03/10/2023 (Approximate)   SpO2 97%   Visual Acuity Right Eye Distance:   Left Eye Distance:   Bilateral Distance:    Right Eye Near:   Left Eye Near:    Bilateral Near:     Physical Exam Vitals and nursing note reviewed.  Constitutional:      General: She is awake. She is not in acute distress.    Appearance: She is well-developed.     Comments: She is sitting comfortably on the exam chair in no acute distress talking in complete sentences.   HENT:     Head: Normocephalic and atraumatic.     Right Ear: Hearing normal.     Left Ear: Hearing normal.  Eyes:     Extraocular Movements: Extraocular movements intact.     Conjunctiva/sclera: Conjunctivae normal.  Cardiovascular:     Rate and Rhythm: Regular rhythm. Tachycardia present.     Heart sounds: Normal heart sounds. No murmur heard. Pulmonary:     Effort: Pulmonary effort is normal. No respiratory distress.     Breath sounds: Normal breath sounds and air entry. No decreased air movement. No decreased breath sounds, wheezing, rhonchi or rales.  Musculoskeletal:        General: Normal range of motion.     Right lower leg: Swelling and tenderness present.       Legs:     Comments: Has full range of motion of lower legs/ankles and feet but some muscle pain with right ankle flexion and rotation. Multiple scratches and ?insect bite marks present on right and left lower legs. Redness had started around 1 lesion on right central lower shin area and has spread to midway up shin and calf. Warm to touch and swollen. No numbness or neuro deficits noted. Good capillary refill and 2+ pulses bilaterally.   Skin:    General: Skin is warm.     Capillary Refill: Capillary refill takes less than 2 seconds.     Findings: Erythema, rash and wound (multiple insect bites present on right and left lower  leg in various stages of healing) present. No abscess, burn, ecchymosis or petechiae. Rash is macular and papular. Rash is not crusting, nodular, scaling or vesicular.  Neurological:     General: No focal deficit present.     Mental Status: She is alert and oriented to person, place, and time.     Sensory: Sensation is intact. No sensory deficit.     Motor:  Motor function is intact.  Psychiatric:        Mood and Affect: Mood normal.        Behavior: Behavior normal. Behavior is cooperative.        Thought Content: Thought content normal.        Judgment: Judgment normal.      UC Treatments / Results  Labs (all labs ordered are listed, but only abnormal results are displayed) Labs Reviewed - No data to display  EKG   Radiology No results found.  Procedures Procedures (including critical care time)  Medications Ordered in UC Medications - No data to display  Initial Impression / Assessment and Plan / UC Course  I have reviewed the triage vital signs and the nursing notes.  Pertinent labs & imaging results that were available during my care of the patient were reviewed by me and considered in my medical decision making (see chart for details).     Rapid Influenza test and PCR COVID test was negative from visit from yesterday. Discussed that fever, chills and headache may be due to skin infection/cellulitis that has now developed in right lower leg and getting worse. Patient is stable but shows signs of systemic involvement- will start Doxycycline 100mg  twice a day with food for 10 days. May continue OTC Tylenol 1000mg  every 8 hours as needed for fever or pain. May also alternate Tylenol every 4 hours with OTC Ibuprofen 600mg  as needed for pain and swelling. Continue to elevate right leg as much as possible. May use OTC Hydrocortisone cream around insect bites- apply 2 to 3 times a day as needed for comfort. Continue to monitor symptoms and redness/swelling of right lower leg- if  symptoms worsen and redness continues to spread, return for recheck or go to the ER ASAP for further evaluation.   Final Clinical Impressions(s) / UC Diagnoses   Final diagnoses:  Cellulitis of leg, right  Pain and swelling of right lower leg  Tachycardia, unspecified     Discharge Instructions      Recommend start Doxycycline 100mg  twice a day with food for 10 days. May continue OTC Tylenol 1000mg  every 8 hours as needed. May alternate with Ibuprofen 600mg  every 8 hours as needed. May use OTC Hydrocortisone cream - apply 2 to 3 times a day to area for comfort. Continue to monitor area- if increase in redness, swelling, pain or fever does not starting resolving within 48 hours, return for recheck or go to ER.     ED Prescriptions     Medication Sig Dispense Auth. Provider   doxycycline (VIBRAMYCIN) 100 MG capsule Take 1 capsule (100 mg total) by mouth 2 (two) times daily for 10 days. 20 capsule Shandra Szymborski, Ali Lowe, NP      PDMP not reviewed this encounter.   Sudie Grumbling, NP 03/19/23 2153

## 2023-03-19 NOTE — ED Triage Notes (Signed)
Pt states rash and swelling to her right lower leg for the past week.  LLE red and warm to the touch.

## 2023-09-25 ENCOUNTER — Encounter: Payer: Self-pay | Admitting: Emergency Medicine

## 2023-09-25 ENCOUNTER — Ambulatory Visit
Admission: EM | Admit: 2023-09-25 | Discharge: 2023-09-25 | Disposition: A | Discharge status: Home / Self Care | Payer: Commercial Managed Care - HMO | Attending: Family Medicine | Admitting: Family Medicine

## 2023-09-25 DIAGNOSIS — L03115 Cellulitis of right lower limb: Secondary | ICD-10-CM

## 2023-09-25 MED ORDER — AMOXICILLIN-POT CLAVULANATE 875-125 MG PO TABS: 1.0000 | ORAL_TABLET | Freq: Two times a day (BID) | ORAL | 0 refills | Status: DC

## 2023-09-25 MED ORDER — IBUPROFEN 800 MG PO TABS
800.0000 mg | ORAL_TABLET | Freq: Once | ORAL | Status: AC
  Administered 2023-09-25: 800 mg via ORAL

## 2023-09-25 NOTE — ED Triage Notes (Signed)
Pt presents with a rash on her right shin since yesterday.

## 2023-09-27 NOTE — ED Provider Notes (Signed)
  [facility]   [id number] 09/25/23 Arrival Time: 1224  ASSESSMENT & PLAN:  1. Cellulitis of right lower extremity    She is comfortable with outpt antibiotic trial. Begin: Discharge Medication List as of 09/25/2023  1:24 PM     START taking these medications   Details  amoxicillin-clavulanate (AUGMENTIN) 875-125 MG tablet Take 1 tablet by mouth every 12 (twelve) hours., Starting Sat 09/25/2023, Normal       Recommend:  Follow-up Information     [facility].   Specialty: Emergency Medicine Why: If symptoms worsen in any way. Contact information: [address] [place] [address] [phone]                Reviewed expectations re: course of current medical issues. Questions answered. Outlined signs and symptoms indicating need for more acute intervention. Patient verbalized understanding. After Visit Summary given.  SUBJECTIVE: History from: patient. Sydney Coffey is a 32 y.o. female who reports "rash"; lower leg above ankle on the RIGHT; noted yesterday; worse today. Subj fever. With chills. Ambulatory without difficulty. Denies leg injury.   Past Surgical History:  Procedure Laterality Date   ANKLE SURGERY Right       OBJECTIVE:  Vitals:   09/25/23 1310  BP: (!) 150/74  Pulse: (!) 120  Resp: 18  Temp: (!) 101 F (38.3 C)  TempSrc: Oral  SpO2: 96%    General appearance: alert; no distress HEENT: [place]f; AT Neck: supple with FROM Resp: unlabored respirations Extremities: RLE: warm with well perfused appearance; large area of blanchable erythema over RLE above ankle; somewhat TTP; normal DP pulse of R foot; normal distal sensation Neurologic: gait normal; normal sensation and strength of RLE Psychological: alert and cooperative; normal mood and affect    No Known Allergies  Past Medical History:  Diagnosis Date   ADD (attention deficit disorder)    Social History    Socioeconomic History   Marital status: Single    Spouse name: Not on file   Number of children: Not on file   Years of education: Not on file   Highest education level: Not on file  Occupational History   Not on file  Tobacco Use   Smoking status: Never   Smokeless tobacco: Never  Vaping Use   Vaping status: Never Used  Substance and Sexual Activity   Alcohol use: Yes    Comment: rarely   Drug use: No   Sexual activity: Not on file  Other Topics Concern   Not on file  Social History Narrative   Not on file   Social Drivers of Health   Financial Resource Strain: Not on file  Food Insecurity: Not on file  Transportation Needs: Not on file  Physical Activity: Not on file  Stress: Not on file  Social Connections: Not on file   No family history on file. Past Surgical History:  Procedure Laterality Date   ANKLE SURGERY Right        Mardella Layman, MD 09/27/23 1247

## 2024-09-09 ENCOUNTER — Ambulatory Visit (HOSPITAL_COMMUNITY)
Admission: EM | Admit: 2024-09-09 | Discharge: 2024-09-09 | Disposition: A | Discharge status: Home / Self Care | Payer: Worker's Compensation

## 2024-09-09 ENCOUNTER — Encounter (HOSPITAL_COMMUNITY): Payer: Self-pay

## 2024-09-09 DIAGNOSIS — S61451A Open bite of right hand, initial encounter: Secondary | ICD-10-CM

## 2024-09-09 DIAGNOSIS — W540XXA Bitten by dog, initial encounter: Secondary | ICD-10-CM

## 2024-09-09 MED ORDER — AMOXICILLIN-POT CLAVULANATE 875-125 MG PO TABS: 1.0000 | ORAL_TABLET | Freq: Two times a day (BID) | ORAL | 0 refills | Status: AC

## 2024-09-09 NOTE — Discharge Instructions (Addendum)
-  Start the antibiotic-Augmentin  (amoxicillin -clavulanate), 1 pill every 12 hours for 7 days.  You can take this with food like with breakfast and dinner. -Wash your wound with gentle soap and water 2+ times daily.  Let air dry or gently pat. You can follow with over-the-counter neosporin ointment (or similar). Keep wrapped during the day or when you're doing something that could get it dirty (working, [employer], cooking, catering manager). Avoid cleansing with hydrogen peroxide or alcohol!! -Seek additional medical attention if the wound is getting worse instead of better- redness increasing in size, pain getting worse, new/worsening discharge, new fevers/chills, etc.

## 2024-09-09 NOTE — ED Triage Notes (Signed)
 Patient presenting with a dog bite to the right palm of the hand. 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 States she has worked with this dog before but he snapped at her hand today.   States the dog is up to date on his rabies vaccines.

## 2024-09-09 NOTE — ED Provider Notes (Signed)
 [facility]    CSN: [id number] Arrival date & time: 09/09/24  0808      History   Chief Complaint Chief Complaint  Patient presents with   Animal Bite    HPI Sydney Coffey is a 33 y.o. female presenting w dog bite, sustained today.  Patient presenting with a dog bite to the right palm of the hand. 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 States she has worked with this dog before but he snapped at her hand today. States the dog is up to date on his rabies vaccines. Patient states she has had her Tdap in the last 5 years. OCP contraception.    HPI  Past Medical History:  Diagnosis Date   ADD (attention deficit disorder)     There are no active problems to display for this patient.   Past Surgical History:  Procedure Laterality Date   ANKLE SURGERY Right     OB History   No obstetric history on file.      Home Medications    Prior to Admission medications   Medication Sig Start Date End Date Taking? Authorizing Provider  amoxicillin -clavulanate (AUGMENTIN ) 875-125 MG tablet Take 1 tablet by mouth every 12 (twelve) hours. 09/09/24  Yes Shenae Bonanno E, PA-C  norethindrone (AYGESTIN) 5 MG tablet Take 5 mg by mouth daily. 09/24/21  Yes [provider]    Family History History reviewed. No pertinent family history.  Social History Social History   Tobacco Use   Smoking status: Never   Smokeless tobacco: Never  Vaping Use   Vaping status: Never Used  Substance Use Topics   Alcohol use: Yes    Comment: rarely   Drug use: No     Allergies   Patient has no known allergies.   Review of Systems Review of Systems  Skin:  Positive for wound.     Physical Exam Triage Vital Signs ED Triage Vitals  Encounter Vitals Group     BP      Girls Systolic BP Percentile      Girls Diastolic BP Percentile      Boys Systolic BP Percentile      Boys Diastolic BP Percentile      Pulse      Resp      Temp      Temp  src      SpO2      Weight      Height      Head Circumference      Peak Flow      Pain Score      Pain Loc      Pain Education      Exclude from Growth Chart    No data found.  Updated Vital Signs BP (!) 160/84 (BP Location: Left Wrist)   Pulse (!) 102   Temp 98 F (36.7 C) (Oral)   Resp 18   Ht 5' 4 (1.626 m)   Wt 300 lb (136.1 kg)   LMP 08/30/2024 (Exact Date)   SpO2 95%   BMI 51.49 kg/m   Visual Acuity Right Eye Distance:   Left Eye Distance:   Bilateral Distance:    Right Eye Near:   Left Eye Near:    Bilateral Near:     Physical Exam Vitals reviewed.  Constitutional:      General: She is not in acute distress.    Appearance: Normal appearance. She is not ill-appearing.  HENT:  Head: Normocephalic and atraumatic.  Pulmonary:     Effort: Pulmonary effort is normal.  Skin:    Comments: See image below R hand, palmar aspect: There is a 8mm puncture wound, with a 2mm satellite puncture wound. Active bleeding. No surrounding warmth. ROM fingers intact. There is no visible tendon or bone.  Neurological:     General: No focal deficit present.     Mental Status: She is alert and oriented to person, place, and time.  Psychiatric:        Mood and Affect: Mood normal.        Behavior: Behavior normal.        Thought Content: Thought content normal.        Judgment: Judgment normal.      R hand   UC Treatments / Results  Labs (all labs ordered are listed, but only abnormal results are displayed) Labs Reviewed - No data to display  EKG   Radiology No results found.  Procedures Procedures (including critical care time)  Medications Ordered in UC Medications - No data to display  Initial Impression / Assessment and Plan / UC Course  I have reviewed the triage vital signs and the nursing notes.  Pertinent labs & imaging results that were available during my care of the patient were reviewed by me and considered in my medical decision making  (see chart for details).     Patient is a pleasant 33 year old female presenting with puncture wound/dog bite to the right hand, sustained today.  She is neurovascularly intact.  She is not immunocompromised.  The wound does not require closure.  She is up-to-date on her Tdap.  Discussed risk of infection, and sent Augmentin .  Wound care provided and discussed.  Return precautions as below  Final Clinical Impressions(s) / UC Diagnoses   Final diagnoses:  Dog bite of right hand, initial encounter     Discharge Instructions      -Start the antibiotic-Augmentin  (amoxicillin -clavulanate), 1 pill every 12 hours for 7 days.  You can take this with food like with breakfast and dinner. -Wash your wound with gentle soap and water 2+ times daily.  Let air dry or gently pat. You can follow with over-the-counter neosporin ointment (or similar). Keep wrapped during the day or when you're doing something that could get it dirty (working, [employer], cooking, catering manager). Avoid cleansing with hydrogen peroxide or alcohol!! -Seek additional medical attention if the wound is getting worse instead of better- redness increasing in size, pain getting worse, new/worsening discharge, new fevers/chills, etc.      ED Prescriptions     Medication Sig Dispense Auth. Provider   amoxicillin -clavulanate (AUGMENTIN ) 875-125 MG tablet Take 1 tablet by mouth every 12 (twelve) hours. 14 tablet Ocie Stanzione E, PA-C      PDMP not reviewed this encounter.   Arlyss Leita BRAVO, PA-C 09/09/24 [phone]
